# Patient Record
Sex: Male | Born: 1995 | Race: White | Hispanic: No | Marital: Single | Smoking: Never smoker
Health system: Southern US, Community
[De-identification: ages and names within clinical notes are randomized; demographics above are authoritative.]

---
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
## 2020-09-15 ENCOUNTER — Other Ambulatory Visit: Payer: Self-pay | Admitting: Nurse Practitioner

## 2020-09-15 MED ORDER — PREDNISONE 10 MG PO TABS: ORAL_TABLET | ORAL | 0 refills | Status: DC

## 2020-09-15 MED ORDER — PREDNISONE 10 MG PO TABS: ORAL_TABLET | ORAL | 0 refills | Status: AC

## 2020-09-15 NOTE — Progress Notes (Signed)
-  has intense itching, was diagnosed with scabies infestation -hasn't started anti-infestation medication, but has this prescribed and will start tomorrow -his regular provider is not available today, likely due to snow/closures -prescribed an emergency dose of prednisone since his regular provider is not available today -he can establish care at a later date if he desires
